# Patient Record
Sex: Female | Born: 1990 | Hispanic: No | Marital: Married | Smoking: Never smoker
Health system: Southern US, Community
[De-identification: ages and names within clinical notes are randomized; demographics above are authoritative.]

---

## 2020-07-09 DIAGNOSIS — H833X1 Noise effects on right inner ear: Secondary | ICD-10-CM | POA: Insufficient documentation

## 2020-07-09 DIAGNOSIS — H6123 Impacted cerumen, bilateral: Secondary | ICD-10-CM | POA: Insufficient documentation

## 2021-07-04 ENCOUNTER — Encounter: Payer: Self-pay | Admitting: Family Medicine

## 2021-07-04 ENCOUNTER — Ambulatory Visit: Payer: Self-pay

## 2021-07-04 ENCOUNTER — Other Ambulatory Visit: Payer: Self-pay

## 2021-07-04 ENCOUNTER — Ambulatory Visit (INDEPENDENT_AMBULATORY_CARE_PROVIDER_SITE_OTHER): Payer: 59 | Admitting: Family Medicine

## 2021-07-04 ENCOUNTER — Ambulatory Visit
Admission: RE | Admit: 2021-07-04 | Discharge: 2021-07-04 | Disposition: A | Discharge status: Home / Self Care | Payer: 59 | Source: Ambulatory Visit | Attending: Family Medicine | Admitting: Family Medicine

## 2021-07-04 VITALS — BP 110/72 | HR 74 | Temp 97.2°F | Ht 65.0 in | Wt 158.2 lb

## 2021-07-04 DIAGNOSIS — R091 Pleurisy: Secondary | ICD-10-CM | POA: Insufficient documentation

## 2021-07-04 DIAGNOSIS — R072 Precordial pain: Secondary | ICD-10-CM | POA: Insufficient documentation

## 2021-07-04 DIAGNOSIS — R079 Chest pain, unspecified: Secondary | ICD-10-CM | POA: Diagnosis not present

## 2021-07-04 DIAGNOSIS — Z0000 Encounter for general adult medical examination without abnormal findings: Secondary | ICD-10-CM

## 2021-07-04 NOTE — Progress Notes (Addendum)
New Patient Office Visit  Subjective:  Patient ID: Angela Forbes, female    DOB: 13-Feb-1991  Age: 30 y.o. MRN: [id number]  CC:  Chief Complaint  Patient presents with   Establish Care    NP/establish care concerns about chest pains some SOB pain radiates to left arm.     HPI Angela Forbes presents for for health check and follow-up of an approximately 1 month history of chest pain that she feels in the midsternal area sometimes seems to radiate up into the neck.  It last for seconds.  More rested recently is been associated with a deep breath.  It is not exertional.  There is no DOE, SOB, N/V, diaphoresis or exertional component.  There is no cough fever or chills.  No history of DVT.  She does not smoke drink alcohol or use illicit drugs.  She and her husband are using barrier contraception.  Her father died at age 2 after he presented to the emergency room with abdominal pain.  Mother is still living and she does have hypertension.  She is accompanied today by her husband pends to 2 small children.  Female care is through GYN provider.  Normal Pap this past year.    History reviewed. No pertinent past medical history.  History reviewed. No pertinent surgical history.  Family History  Problem Relation Age of Onset   Diabetes Mother    Hypertension Mother     Social History   Socioeconomic History   Marital status: Married    Spouse name: Not on file   Number of children: Not on file   Years of education: Not on file   Highest education level: Not on file  Occupational History   Not on file  Tobacco Use   Smoking status: Never   Smokeless tobacco: Never  Vaping Use   Vaping Use: Never used  Substance and Sexual Activity   Alcohol use: Never   Drug use: Never   Sexual activity: Yes  Other Topics Concern   Not on file  Social History Narrative   Not on file   Social Determinants of Health   Financial Resource Strain: Not on file  Food Insecurity: Not on  file  Transportation Needs: Not on file  Physical Activity: Not on file  Stress: Not on file  Social Connections: Not on file  Intimate Partner Violence: Not on file    ROS Review of Systems  Constitutional:  Negative for chills, diaphoresis, fatigue, fever and unexpected weight change.  HENT: Negative.    Eyes:  Negative for photophobia and visual disturbance.  Respiratory:  Negative for cough, chest tightness, shortness of breath and wheezing.   Cardiovascular:  Positive for chest pain. Negative for palpitations and leg swelling.  Gastrointestinal:  Negative for abdominal pain, nausea and vomiting.  Endocrine: Negative for polyphagia and polyuria.  Genitourinary: Negative.   Musculoskeletal:  Negative for gait problem and joint swelling.  Skin:  Negative for rash.  Neurological:  Negative for speech difficulty and weakness.  Depression screen [facility] 2/9 07/04/2021  Decreased Interest 0  Down, Depressed, Hopeless 0  PHQ - 2 Score 0       Objective:   Today's Vitals: BP 110/72 (BP Location: Right Arm, Patient Position: Sitting, Cuff Size: Normal)   Pulse 74   Temp (!) 97.2 F (36.2 C) (Temporal)   Ht 5\' 5"  (1.651 m)   Wt 158 lb 3.2 oz (71.8 kg)   SpO2 98%   BMI 26.33 kg/m  Physical Exam Vitals and nursing note reviewed.  Constitutional:      General: She is not in acute distress.    Appearance: Normal appearance. She is normal weight. She is not ill-appearing, toxic-appearing or diaphoretic.  HENT:     Head: Normocephalic and atraumatic.     Right Ear: Tympanic membrane, ear canal and external ear normal.     Left Ear: Tympanic membrane, ear canal and external ear normal.     Mouth/Throat:     Mouth: Mucous membranes are moist.     Pharynx: Oropharynx is clear. No oropharyngeal exudate or posterior oropharyngeal erythema.  Eyes:     General: No scleral icterus.       Right eye: No discharge.        Left eye: No discharge.     Extraocular Movements: Extraocular  movements intact.     Conjunctiva/sclera: Conjunctivae normal.     Pupils: Pupils are equal, round, and reactive to light.  Cardiovascular:     Rate and Rhythm: Normal rate and regular rhythm.     Heart sounds: No murmur heard. Pulmonary:     Effort: Pulmonary effort is normal.     Breath sounds: Normal breath sounds.  Abdominal:     General: Bowel sounds are normal.  Musculoskeletal:     Cervical back: No rigidity or tenderness.     Right lower leg: No edema.     Left lower leg: No edema.  Lymphadenopathy:     Cervical: No cervical adenopathy.  Skin:    General: Skin is warm and dry.  Neurological:     Mental Status: She is alert and oriented to person, place, and time.  Psychiatric:        Mood and Affect: Mood normal.        Behavior: Behavior normal.    Assessment & Plan:   Problem List Items Addressed This Visit   None Visit Diagnoses     Healthcare maintenance    -  Primary   Relevant Orders   CBC   Comprehensive metabolic panel   LDL cholesterol, direct   Urinalysis, Routine w reflex microscopic   Chest pain, unspecified type       Relevant Orders   EKG 12-Lead   DG Chest 2 View       Outpatient Encounter Medications as of 07/04/2021  Medication Sig   Prenatal MV-Min-Fe Fum-FA-DHA (PRENATAL 1 PO) Take 1 tablet by mouth daily.   No facility-administered encounter medications on file as of 07/04/2021.    Follow-up: Return in about 1 month (around 08/03/2021), or if symptoms worsen or fail to improve.  Given information on health maintenance and disease prevention.  Follow-up in 1 month for recheck.  Symptoms suggest pleurisy but there is no cough or fever.  Risk of serious etiology unlikely, is 30 year old otherwise healthy female. Angela Sax, MD

## 2021-07-05 LAB — COMPREHENSIVE METABOLIC PANEL
AG Ratio: 1.5 (calc) (ref 1.0–2.5)
ALT: 11 U/L (ref 6–29)
AST: 17 U/L (ref 10–30)
Albumin: 4.5 g/dL (ref 3.6–5.1)
Alkaline phosphatase (APISO): 67 U/L (ref 31–125)
BUN/Creatinine Ratio: 23 (calc) — ABNORMAL HIGH (ref 6–22)
BUN: 10 mg/dL (ref 7–25)
CO2: 25 mmol/L (ref 20–32)
Calcium: 9.3 mg/dL (ref 8.6–10.2)
Chloride: 101 mmol/L (ref 98–110)
Creat: 0.44 mg/dL — ABNORMAL LOW (ref 0.50–0.97)
Globulin: 3 g/dL (calc) (ref 1.9–3.7)
Glucose, Bld: 85 mg/dL (ref 65–99)
Potassium: 3.7 mmol/L (ref 3.5–5.3)
Sodium: 138 mmol/L (ref 135–146)
Total Bilirubin: 0.3 mg/dL (ref 0.2–1.2)
Total Protein: 7.5 g/dL (ref 6.1–8.1)

## 2021-07-05 LAB — CBC
HCT: 35.3 % (ref 35.0–45.0)
Hemoglobin: 11.7 g/dL (ref 11.7–15.5)
MCH: 29.3 pg (ref 27.0–33.0)
MCHC: 33.1 g/dL (ref 32.0–36.0)
MCV: 88.3 fL (ref 80.0–100.0)
MPV: 10.1 fL (ref 7.5–12.5)
Platelets: 250 10*3/uL (ref 140–400)
RBC: 4 10*6/uL (ref 3.80–5.10)
RDW: 12.5 % (ref 11.0–15.0)
WBC: 6.2 10*3/uL (ref 3.8–10.8)

## 2021-07-05 LAB — LDL CHOLESTEROL, DIRECT: Direct LDL: 73 mg/dL (ref <100)

## 2021-07-05 LAB — TIQ-MISC

## 2021-07-07 ENCOUNTER — Other Ambulatory Visit: Payer: Self-pay | Admitting: Family Medicine

## 2021-07-07 ENCOUNTER — Ambulatory Visit (INDEPENDENT_AMBULATORY_CARE_PROVIDER_SITE_OTHER): Payer: 59

## 2021-07-07 ENCOUNTER — Telehealth: Payer: Self-pay | Admitting: Family Medicine

## 2021-07-07 DIAGNOSIS — R079 Chest pain, unspecified: Secondary | ICD-10-CM

## 2021-07-07 NOTE — Telephone Encounter (Signed)
Pt called and requested call back about lab and xray results, please advise. Callback: [phone]

## 2021-07-09 NOTE — Telephone Encounter (Signed)
Patient aware of labs and xray

## 2021-07-10 ENCOUNTER — Telehealth: Payer: Self-pay | Admitting: Family Medicine

## 2021-07-17 NOTE — Telephone Encounter (Signed)
Patient was called will pick it up!

## 2021-08-05 ENCOUNTER — Ambulatory Visit: Payer: 59 | Admitting: Family Medicine

## 2022-08-18 LAB — HM HEPATITIS C SCREENING LAB: HM Hepatitis Screen: NEGATIVE

## 2022-12-22 LAB — HM HIV SCREENING LAB: HM HIV Screening: NEGATIVE

## 2023-01-07 IMAGING — DX DG CHEST 2V
2 series · 2 of 2 positions shown · non-contrast
Comparison: None.

CLINICAL DATA: Chest pain.

EXAM:
CHEST - 2 VIEW

[chest pa]
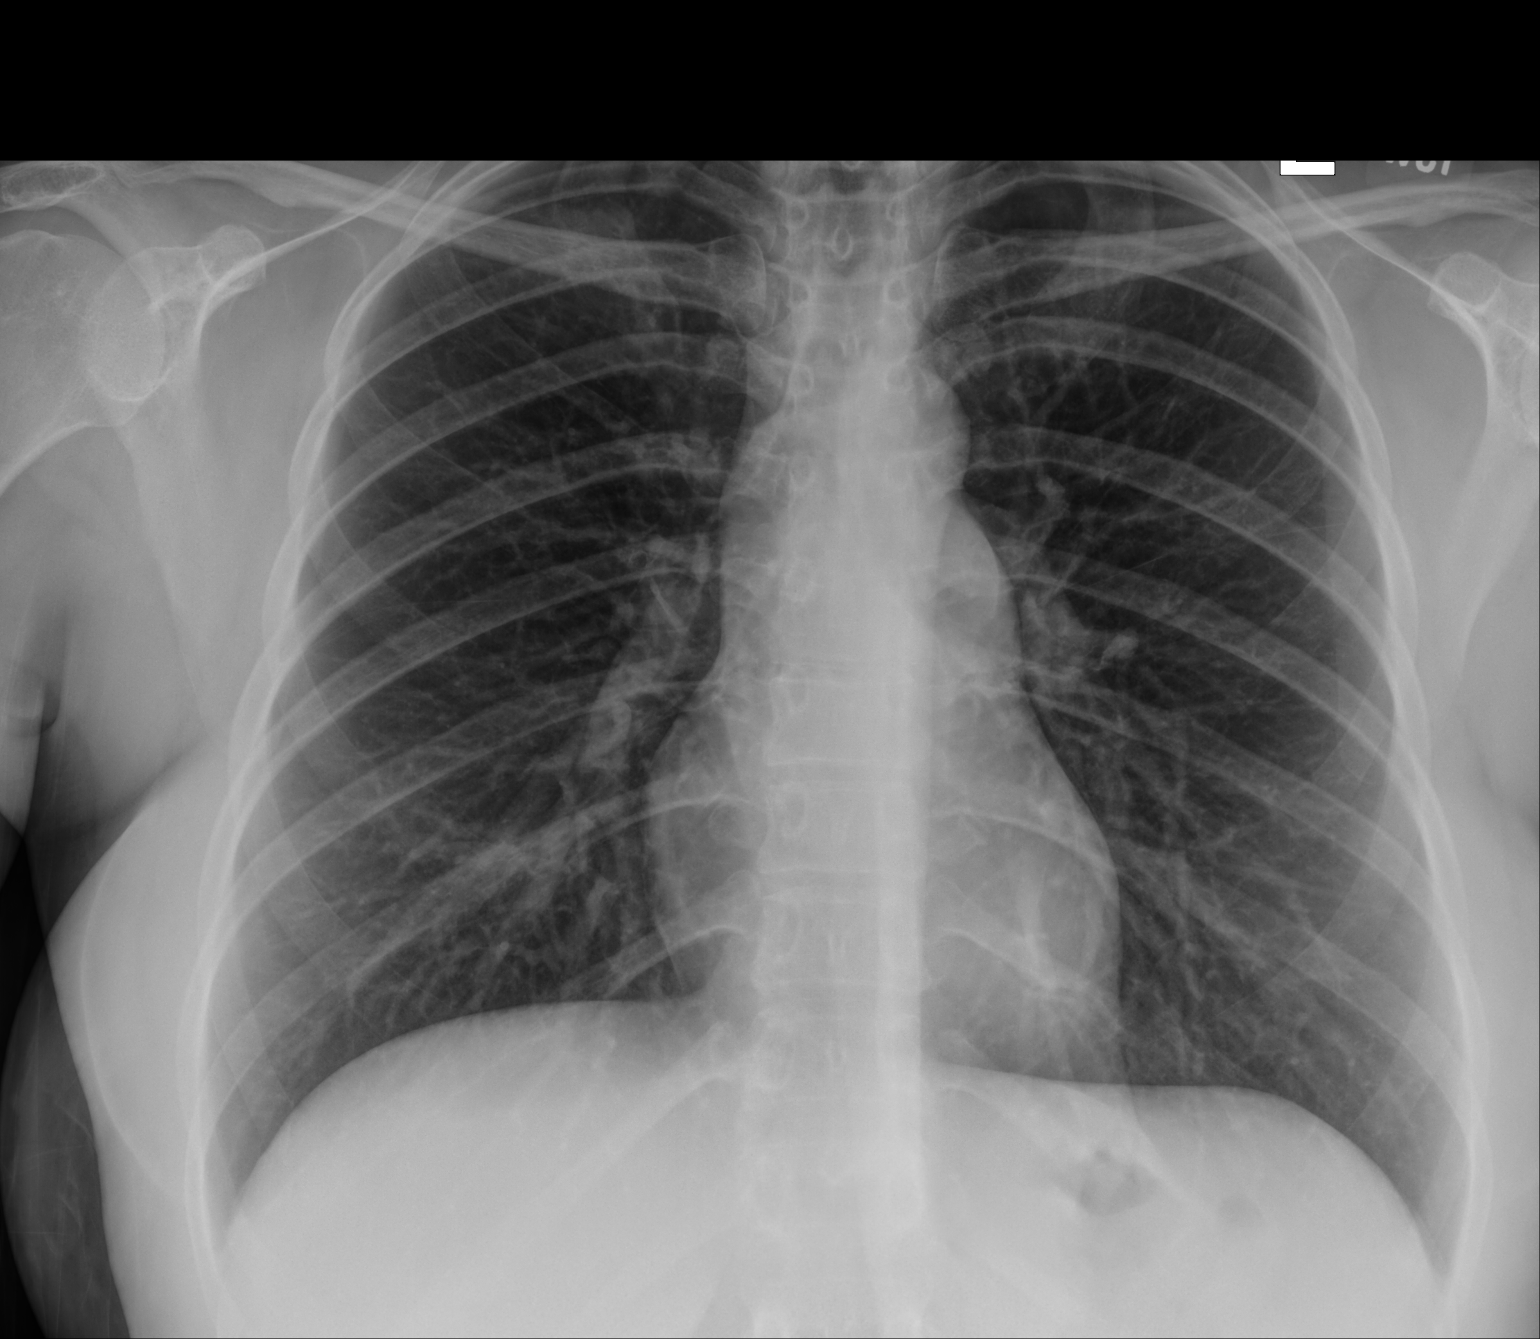

[chest lat]
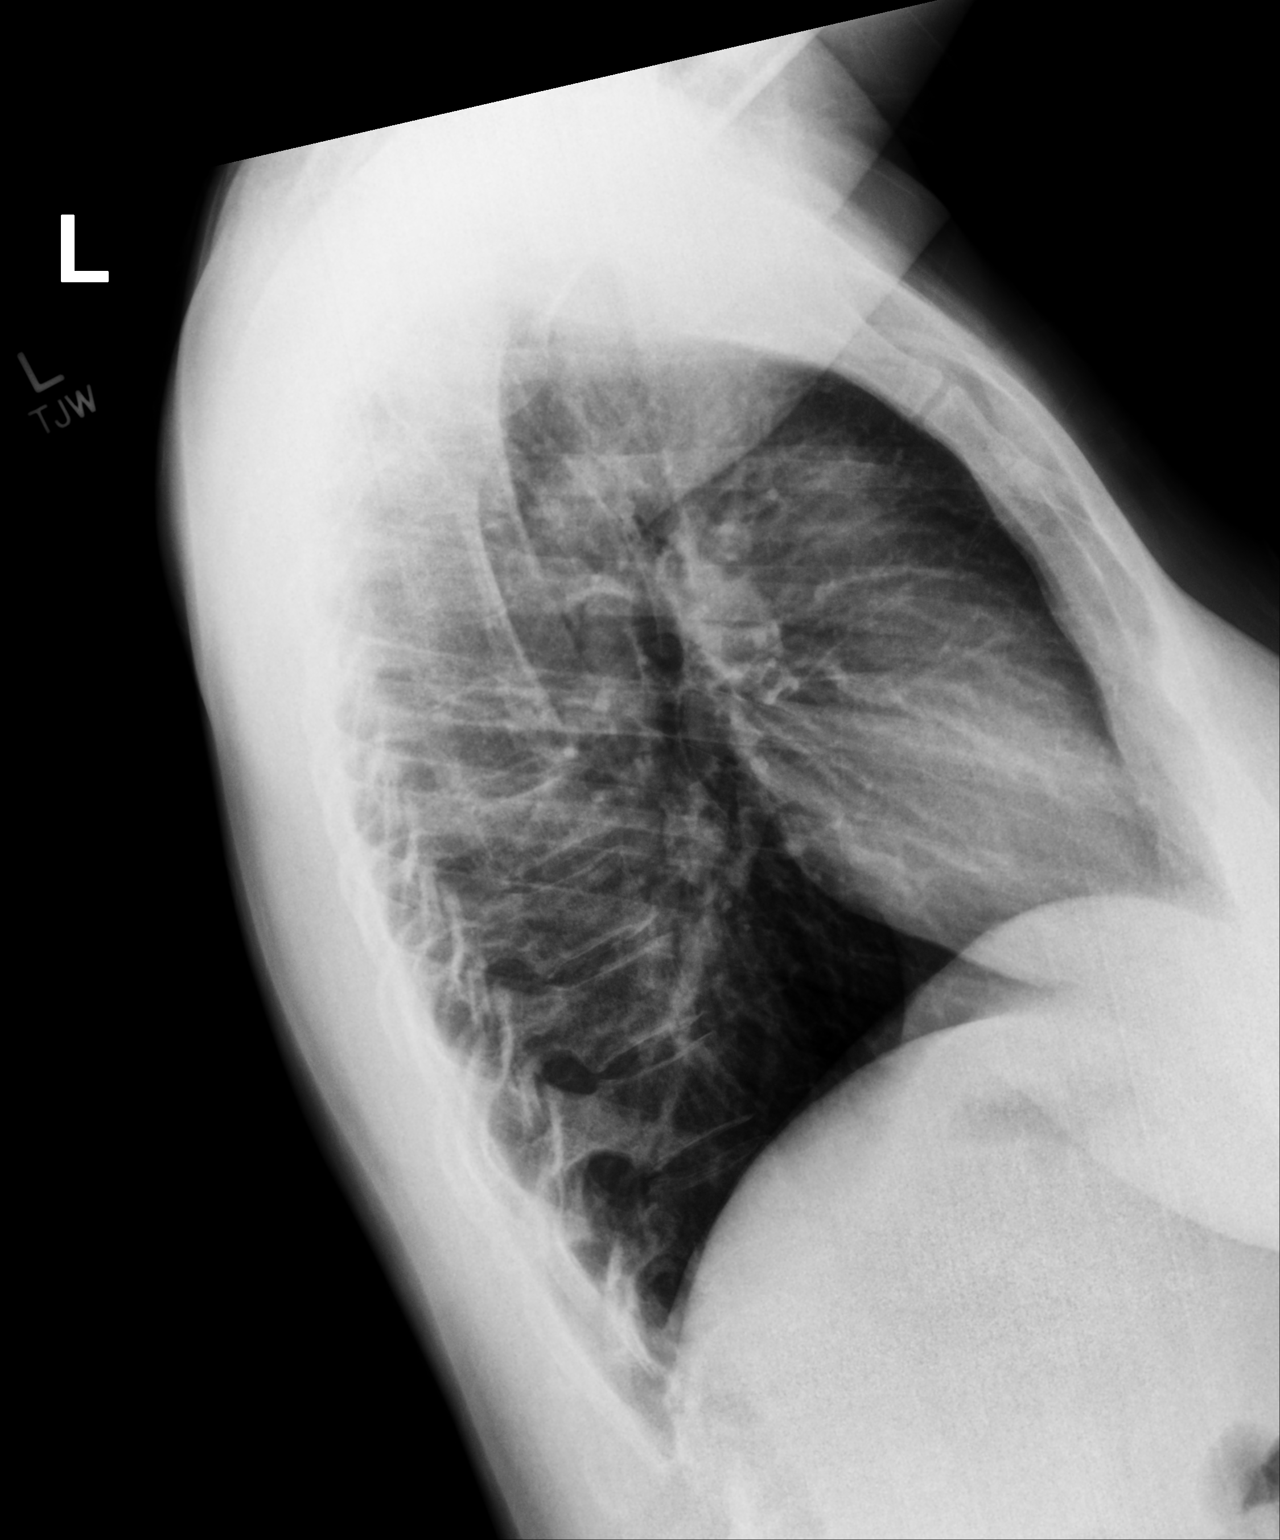

[2 of 2 positions shown; findings below may reference images not displayed]

FINDINGS: The heart size and mediastinal contours are within normal limits.
Both lungs are clear. The visualized skeletal structures are
unremarkable.
IMPRESSION: No active cardiopulmonary disease.

## 2023-06-03 ENCOUNTER — Ambulatory Visit
Admission: EM | Admit: 2023-06-03 | Discharge: 2023-06-03 | Disposition: A | Discharge status: Home / Self Care | Payer: Medicaid Other | Attending: Internal Medicine | Admitting: Internal Medicine

## 2023-06-03 DIAGNOSIS — H6991 Unspecified Eustachian tube disorder, right ear: Secondary | ICD-10-CM | POA: Diagnosis not present

## 2023-06-03 DIAGNOSIS — H60501 Unspecified acute noninfective otitis externa, right ear: Secondary | ICD-10-CM

## 2023-06-03 MED ORDER — FLUTICASONE PROPIONATE 50 MCG/ACT NA SUSP
1.0000 | Freq: Every day | NASAL | 0 refills | Status: AC
Start: 2023-06-03 — End: ?

## 2023-06-03 MED ORDER — NEOMYCIN-POLYMYXIN-HC 3.5-10000-1 OT SUSP
4.0000 [drp] | Freq: Three times a day (TID) | OTIC | 0 refills | Status: AC
Start: 2023-06-03 — End: 2023-06-10

## 2023-06-03 NOTE — ED Provider Notes (Signed)
[facility]    CSN: [id number] Arrival date & time: 06/03/23  5009      History   Chief Complaint No chief complaint on file.   HPI Angela Forbes is a 32 y.o. female presents for evaluation of right ear pain.  Patient reports 1 day of right ear pain that occurred after using a Q-tip.  Reports an aching/sharp pain without any ear drainage, hearing changes, fevers or chills.  Denies any URI symptoms.  Does endorse some popping or crackling with chewing.  She has been taking ibuprofen OTC for symptoms.  She is currently breast-feeding.  No other concerns at  HPI  History reviewed. No pertinent past medical history.  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 No pertinent surgical history.  OB History   No obstetric history on file.      Home Medications    Prior to Admission medications   Medication Sig Start Date End Date Taking? Authorizing Provider  ferrous sulfate 325 (65 FE) MG tablet Take by mouth. 01/27/23 01/27/24 Yes [provider]  fluticasone (FLONASE) 50 MCG/ACT nasal spray Place 1 spray into both nostrils daily. 06/03/23  Yes Radford Pax, NP  neomycin-polymyxin-hydrocortisone (CORTISPORIN) 3.5-10000-1 OTIC suspension Place 4 drops into the right ear 3 (three) times daily for 7 days. 06/03/23 06/10/23 Yes Radford Pax, NP  Prenatal MV-Min-Fe Fum-FA-DHA (PRENATAL 1 PO) Take 1 tablet by mouth daily. 08/15/19   [provider]    Family History Family History  Problem Relation Age of Onset   Diabetes Mother    Hypertension Mother     Social History Social History   Tobacco Use   Smoking status: Never   Smokeless tobacco: Never  Vaping Use   Vaping status: Never  Used  Substance Use Topics   Alcohol use: Never   Drug use: Never     Allergies   Gelatin   Review of Systems Review of Systems  HENT:  Positive for ear pain.      Physical Exam Triage Vital Signs ED Triage Vitals  Encounter Vitals Group     BP 06/03/23 0850 108/73     Systolic BP Percentile --      Diastolic BP Percentile --      Pulse Rate 06/03/23 0850 69     Resp 06/03/23 0850 16     Temp 06/03/23 0850 (!) 97.5 F (36.4 C)     Temp Source 06/03/23 0850 Oral     SpO2 06/03/23 0850 97 %     Weight --      Height --      Head Circumference --      Peak Flow --      Pain Score 06/03/23 0854 8     Pain Loc --      Pain Education --      Exclude from Growth Chart --    No data found.  Updated Vital Signs BP 108/73 (BP Location: Right Arm)   Pulse 69   Temp (!) 97.5 F (36.4 C) (Oral)   Resp 16   SpO2 97%   Breastfeeding Yes   Visual Acuity Right Eye Distance:   Left Eye Distance:   Bilateral Distance:    Right  Eye Near:   Left Eye Near:    Bilateral Near:     Physical Exam Vitals and nursing note reviewed.  Constitutional:      General: She is not in acute distress.    Appearance: Normal appearance. She is not ill-appearing.  HENT:     Head: Normocephalic and atraumatic.     Right Ear: Swelling present. A middle ear effusion is present. Tympanic membrane is scarred. Tympanic membrane is not perforated or erythematous.     Left Ear: Tympanic membrane and ear canal normal.     Ears:     Comments: Mild swelling of the right external canal Eyes:     Pupils: Pupils are equal, round, and reactive to light.  Cardiovascular:     Rate and Rhythm: Normal rate.  Pulmonary:     Effort: Pulmonary effort is normal.  Skin:    General: Skin is warm and dry.  Neurological:     General: No focal deficit present.     Mental Status: She is alert and oriented to person, place, and time.  Psychiatric:        Mood and Affect: Mood normal.        Behavior:  Behavior normal.      UC Treatments / Results  Labs (all labs ordered are listed, but only abnormal results are displayed) Labs Reviewed - No data to display  EKG   Radiology No results found.  Procedures Procedures (including critical care time)  Medications Ordered in UC Medications - No data to display  Initial Impression / Assessment and Plan / UC Course  I have reviewed the triage vital signs and the nursing notes.  Pertinent labs & imaging results that were available during my care of the patient were reviewed by me and considered in my medical decision making (see chart for details).     Reviewed exam and symptoms with patient.  No red flags.  Will start Flonase daily for eustachian tube dysfunction as well as Cortisporin for mild otitis externa.  Patient to keep water out of the ear until treatment is complete.  PCP follow-up if symptoms do not improve.  ER precautions reviewed and patient verbalized understanding. Final Clinical Impressions(s) / UC Diagnoses   Final diagnoses:  Acute otitis externa of right ear, unspecified type  Eustachian tube dysfunction, right     Discharge Instructions      Start antibiotic eardrops and Flonase nasal spray as prescribed.  Keep water out of that right ear until you are done with your treatment.  Please follow-up with your PCP if your symptoms or not improving.  Please go to the ER if you develop any worsening symptoms.  I hope you feel better soon!    ED Prescriptions     Medication Sig Dispense Auth. Provider   fluticasone (FLONASE) 50 MCG/ACT nasal spray Place 1 spray into both nostrils daily. 15.8 mL Radford Pax, NP   neomycin-polymyxin-hydrocortisone (CORTISPORIN) 3.5-10000-1 OTIC suspension Place 4 drops into the right ear 3 (three) times daily for 7 days. 10 mL Radford Pax, NP      PDMP not reviewed this encounter.   Radford Pax, NP 06/03/23 [phone]

## 2023-06-03 NOTE — ED Triage Notes (Addendum)
Pt presents to UC w/ c/o right ear pain since yesterday after using a qtip. Pain radiated to right jaw and pt states it was too painful to eat yesterday. Pt tried ibuprofen helps a little.

## 2023-06-03 NOTE — Discharge Instructions (Signed)
Start antibiotic eardrops and Flonase nasal spray as prescribed.  Keep water out of that right ear until you are done with your treatment.  Please follow-up with your PCP if your symptoms or not improving.  Please go to the ER if you develop any worsening symptoms.  I hope you feel better soon!

## 2023-06-14 ENCOUNTER — Ambulatory Visit (INDEPENDENT_AMBULATORY_CARE_PROVIDER_SITE_OTHER): Payer: Medicaid Other | Admitting: Family Medicine

## 2023-06-14 ENCOUNTER — Encounter: Payer: Self-pay | Admitting: Family Medicine

## 2023-06-14 VITALS — BP 110/78 | HR 84 | Temp 98.1°F | Ht 65.0 in | Wt 183.0 lb

## 2023-06-14 DIAGNOSIS — Z0000 Encounter for general adult medical examination without abnormal findings: Secondary | ICD-10-CM

## 2023-06-14 DIAGNOSIS — Z13220 Encounter for screening for lipoid disorders: Secondary | ICD-10-CM | POA: Diagnosis not present

## 2023-06-14 DIAGNOSIS — Z0001 Encounter for general adult medical examination with abnormal findings: Secondary | ICD-10-CM | POA: Diagnosis not present

## 2023-06-14 DIAGNOSIS — E611 Iron deficiency: Secondary | ICD-10-CM | POA: Diagnosis not present

## 2023-06-14 DIAGNOSIS — N63 Unspecified lump in unspecified breast: Secondary | ICD-10-CM | POA: Diagnosis not present

## 2023-06-14 DIAGNOSIS — E559 Vitamin D deficiency, unspecified: Secondary | ICD-10-CM

## 2023-06-14 DIAGNOSIS — M7662 Achilles tendinitis, left leg: Secondary | ICD-10-CM | POA: Insufficient documentation

## 2023-06-14 LAB — URINALYSIS, ROUTINE W REFLEX MICROSCOPIC
Bilirubin Urine: NEGATIVE
Ketones, ur: NEGATIVE
Leukocytes,Ua: NEGATIVE
Nitrite: NEGATIVE
Specific Gravity, Urine: 1.025 (ref 1.000–1.030)
Urine Glucose: NEGATIVE
Urobilinogen, UA: 0.2 (ref 0.0–1.0)
pH: 6 (ref 5.0–8.0)

## 2023-06-14 LAB — CBC WITH DIFFERENTIAL/PLATELET
Basophils Absolute: 0 10*3/uL (ref 0.0–0.1)
Basophils Relative: 0.3 % (ref 0.0–3.0)
Eosinophils Absolute: 0.1 10*3/uL (ref 0.0–0.7)
Eosinophils Relative: 1.2 % (ref 0.0–5.0)
HCT: 38.3 % (ref 36.0–46.0)
Hemoglobin: 12.6 g/dL (ref 12.0–15.0)
Lymphocytes Relative: 35.6 % (ref 12.0–46.0)
Lymphs Abs: 1.8 10*3/uL (ref 0.7–4.0)
MCHC: 32.8 g/dL (ref 30.0–36.0)
MCV: 89.9 fl (ref 78.0–100.0)
Monocytes Absolute: 0.4 10*3/uL (ref 0.1–1.0)
Monocytes Relative: 6.9 % (ref 3.0–12.0)
Neutro Abs: 2.9 10*3/uL (ref 1.4–7.7)
Neutrophils Relative %: 56 % (ref 43.0–77.0)
Platelets: 268 10*3/uL (ref 150.0–400.0)
RBC: 4.26 Mil/uL (ref 3.87–5.11)
RDW: 13.8 % (ref 11.5–15.5)
WBC: 5.2 10*3/uL (ref 4.0–10.5)

## 2023-06-14 LAB — COMPREHENSIVE METABOLIC PANEL
ALT: 21 U/L (ref 0–35)
AST: 17 U/L (ref 0–37)
Albumin: 4.3 g/dL (ref 3.5–5.2)
Alkaline Phosphatase: 63 U/L (ref 39–117)
BUN: 14 mg/dL (ref 6–23)
CO2: 25 mEq/L (ref 19–32)
Calcium: 9 mg/dL (ref 8.4–10.5)
Chloride: 103 mEq/L (ref 96–112)
Creatinine, Ser: 0.49 mg/dL (ref 0.40–1.20)
GFR: 124.94 mL/min (ref >60.00)
Glucose, Bld: 77 mg/dL (ref 70–99)
Potassium: 3.7 mEq/L (ref 3.5–5.1)
Sodium: 139 mEq/L (ref 135–145)
Total Bilirubin: 0.6 mg/dL (ref 0.2–1.2)
Total Protein: 7.3 g/dL (ref 6.0–8.3)

## 2023-06-14 LAB — LIPID PANEL
Cholesterol: 159 mg/dL (ref 0–200)
HDL: 54.6 mg/dL (ref >39.00)
LDL Cholesterol: 96 mg/dL (ref 0–99)
NonHDL: 104.7
Total CHOL/HDL Ratio: 3
Triglycerides: 43 mg/dL (ref 0.0–149.0)
VLDL: 8.6 mg/dL (ref 0.0–40.0)

## 2023-06-14 LAB — VITAMIN D 25 HYDROXY (VIT D DEFICIENCY, FRACTURES): VITD: 22.73 ng/mL — ABNORMAL LOW (ref 30.00–100.00)

## 2023-06-14 NOTE — Progress Notes (Addendum)
Of Angela Forbes she is  Established Patient Office Visit   Subjective:  Patient ID: Angela Forbes, female    DOB: January 11, 1991  Age: 32 y.o. MRN: [id number]  Chief Complaint  Patient presents with   Annual Exam    Pt here for Annual Exam and is currently fasting. Pt needing a pap but would like to schedule with a Female provider    HPI Encounter Diagnoses  Name Primary?   Healthcare maintenance Yes   Achilles tendinitis of left lower extremity    Vitamin D deficiency    Iron deficiency    Lipid screening    Mass of breast, unspecified laterality    For physical status post parturition over third child this past July.  Baby is 2 months old and doing well.  Patient developed ear pain 3 weeks ago and was seen in urgent care.  She was prescribed an antibiotic and fluticasone nose spray.  She is having some discomfort in her left ear now.  She denies facial pressure teeth pain postnasal drip or rhinorrhea.  She is having pain in her left heel.  There was no injury.  Her pregnancy was complicated by iron deficiency and vitamin D deficiency.  She received an iron infusion and continues with iron supplementation.  Her father passed in his early 74s.  He had been diagnosed with hypertension.  Her mom is in her mid 32s and as hypertension as well.   Review of Systems  Constitutional: Negative.   HENT:  Positive for ear pain. Negative for congestion, hearing loss, sinus pain, sore throat and tinnitus.   Eyes:  Negative for blurred vision, discharge and redness.  Respiratory: Negative.  Negative for cough.   Cardiovascular: Negative.   Gastrointestinal:  Negative for abdominal pain.  Genitourinary: Negative.   Musculoskeletal:  Positive for joint pain. Negative for myalgias.  Skin:  Negative for rash.  Neurological:  Negative for tingling, loss of consciousness and weakness.  Endo/Heme/Allergies:  Negative for polydipsia.     Current Outpatient Medications:    ferrous sulfate 325 (65 FE) MG  tablet, Take by mouth., Disp: , Rfl:    fluticasone (FLONASE) 50 MCG/ACT nasal spray, Place 1 spray into both nostrils daily., Disp: 15.8 mL, Rfl: 0   Prenatal MV-Min-Fe Fum-FA-DHA (PRENATAL 1 PO), Take 1 tablet by mouth daily., Disp: , Rfl:    Objective:     BP 110/78   Pulse 84   Temp 98.1 F (36.7 C)   Ht 5\' 5"  (1.651 m)   Wt 183 lb (83 kg)   SpO2 98%   BMI 30.45 kg/m    Physical Exam Constitutional:      General: She is not in acute distress.    Appearance: Normal appearance. She is not ill-appearing, toxic-appearing or diaphoretic.  HENT:     Head: Normocephalic and atraumatic.     Right Ear: Tympanic membrane, ear canal and external ear normal.     Left Ear: Tympanic membrane, ear canal and external ear normal.     Mouth/Throat:     Mouth: Mucous membranes are moist.     Pharynx: Oropharynx is clear. No oropharyngeal exudate or posterior oropharyngeal erythema.  Eyes:     General: No scleral icterus.       Right eye: No discharge.        Left eye: No discharge.     Extraocular Movements: Extraocular movements intact.     Conjunctiva/sclera: Conjunctivae normal.     Pupils: Pupils are equal, round, and  reactive to light.  Cardiovascular:     Rate and Rhythm: Normal rate and regular rhythm.  Pulmonary:     Effort: Pulmonary effort is normal. No respiratory distress.     Breath sounds: Normal breath sounds.  Abdominal:     General: Bowel sounds are normal.  Musculoskeletal:     Cervical back: No rigidity or tenderness.     Left ankle: No swelling or deformity. No tenderness. Normal range of motion.     Left Achilles Tendon: No tenderness or defects. Thompson's test negative.     Comments: 5/5 strength lower extremities.  Skin:    General: Skin is warm and dry.  Neurological:     Mental Status: She is alert and oriented to person, place, and time.  Psychiatric:        Mood and Affect: Mood normal.        Behavior: Behavior normal.      No results found for  any visits on 06/14/23.    The ASCVD Risk score (Arnett DK, et al., 2019) failed to calculate for the following reasons:   The 2019 ASCVD risk score is only valid for ages 67 to 68    Assessment & Plan:   Healthcare maintenance -     Comprehensive metabolic panel -     Urinalysis, Routine w reflex microscopic  Achilles tendinitis of left lower extremity -     DG Ankle Complete Left; Future  Vitamin D deficiency -     VITAMIN D 25 Hydroxy (Vit-D Deficiency, Fractures)  Iron deficiency -     CBC with Differential/Platelet -     Iron, TIBC and Ferritin Panel  Lipid screening -     Lipid panel  Mass of breast, unspecified laterality -     Digital Screening Mammogram, Left and Right; Future    Return in about 1 year (around 06/13/2024), or Prefers female for Pap and pelvic exams..  Information was given on health maintenance and disease prevention.  Rechecking vitamin D and iron levels.  Information given on Achilles tendinitis.  Continue fluticasone nasal spray.  Mliss Sax, MD

## 2023-06-14 NOTE — Addendum Note (Signed)
Addended by: Andrez Grime on: 06/14/2023 11:08 AM   Modules accepted: Orders

## 2023-06-15 LAB — IRON,TIBC AND FERRITIN PANEL
%SAT: 46 % — ABNORMAL HIGH (ref 16–45)
Ferritin: 126 ng/mL (ref 16–154)
Iron: 134 ug/dL (ref 40–190)
TIBC: 289 ug/dL (ref 250–450)

## 2023-06-15 MED ORDER — VITAMIN D (ERGOCALCIFEROL) 1.25 MG (50000 UNIT) PO CAPS
50000.0000 [IU] | ORAL_CAPSULE | ORAL | 1 refills | Status: AC
Start: 2023-06-15 — End: ?

## 2023-06-15 NOTE — Addendum Note (Signed)
Addended by: Andrez Grime on: 06/15/2023 07:49 AM   Modules accepted: Orders

## 2023-06-17 ENCOUNTER — Telehealth: Payer: Self-pay | Admitting: Family Medicine

## 2023-06-17 NOTE — Telephone Encounter (Signed)
Pt called and wanted to know about his lab results from her last visit. Please call her

## 2023-06-30 ENCOUNTER — Ambulatory Visit: Payer: Medicaid Other | Admitting: Nurse Practitioner

## 2023-07-01 ENCOUNTER — Ambulatory Visit (INDEPENDENT_AMBULATORY_CARE_PROVIDER_SITE_OTHER): Payer: Medicaid Other | Admitting: Nurse Practitioner

## 2023-07-01 ENCOUNTER — Other Ambulatory Visit (HOSPITAL_COMMUNITY)
Admission: RE | Admit: 2023-07-01 | Discharge: 2023-07-01 | Disposition: A | Discharge status: Home / Self Care | Payer: Medicaid Other | Source: Ambulatory Visit | Attending: Nurse Practitioner | Admitting: Nurse Practitioner

## 2023-07-01 ENCOUNTER — Encounter: Payer: Self-pay | Admitting: Nurse Practitioner

## 2023-07-01 VITALS — BP 104/70 | HR 77 | Temp 97.0°F | Wt 182.0 lb

## 2023-07-01 DIAGNOSIS — E611 Iron deficiency: Secondary | ICD-10-CM

## 2023-07-01 DIAGNOSIS — Z0000 Encounter for general adult medical examination without abnormal findings: Secondary | ICD-10-CM | POA: Diagnosis not present

## 2023-07-01 DIAGNOSIS — N898 Other specified noninflammatory disorders of vagina: Secondary | ICD-10-CM | POA: Insufficient documentation

## 2023-07-01 DIAGNOSIS — Z124 Encounter for screening for malignant neoplasm of cervix: Secondary | ICD-10-CM

## 2023-07-01 DIAGNOSIS — E559 Vitamin D deficiency, unspecified: Secondary | ICD-10-CM | POA: Diagnosis not present

## 2023-07-01 NOTE — Progress Notes (Signed)
BP 104/70 (BP Location: Left Arm)   Pulse 77   Temp (!) 97 F (36.1 C)   Wt 182 lb (82.6 kg)   SpO2 99%   Breastfeeding Yes   BMI 30.29 kg/m    Subjective:    Patient ID: Angela Forbes, female    DOB: August 31, 1991, 32 y.o.   MRN: [id number]  CC: Chief Complaint  Patient presents with   Annual Exam    With PAP    HPI: Angela Forbes is a 32 y.o. female presenting on 07/01/2023 for comprehensive medical examination. Current medical complaints include: vaginal discharge  She states that she had has been having some spotting for a few days every 2 weeks.  She is breast-feeding and had her last baby 3 months ago.  She is having some pelvic pain at times.  She denies dysuria, fevers, vaginal itching and burning.  She has also noticed some vaginal discharge.  She currently lives with: husband, kids Menopausal Symptoms: no  Depression and Anxiety Screen done today and results listed below:     07/01/2023   10:34 AM 06/14/2023    9:25 AM 07/04/2021    3:24 PM  Depression screen PHQ 2/9  Decreased Interest 0 0 0  Down, Depressed, Hopeless 0 0 0  PHQ - 2 Score 0 0 0  Altered sleeping 0    Tired, decreased energy 2    Change in appetite 1    Feeling bad or failure about yourself  0    Trouble concentrating 0    Moving slowly or fidgety/restless 0    Suicidal thoughts 0    PHQ-9 Score 3    Difficult doing work/chores Not difficult at all        07/01/2023   10:35 AM 06/14/2023    9:25 AM  GAD 7 : Generalized Anxiety Score  Nervous, Anxious, on Edge 0 0  Control/stop worrying 0 0  Worry too much - different things 0 0  Trouble relaxing 0 0  Restless 0 0  Easily annoyed or irritable 0 0  Afraid - awful might happen 0 0  Total GAD 7 Score 0 0  Anxiety Difficulty Not difficult at all Not difficult at all    The patient does not have a history of falls. I did not complete a risk assessment for falls. A plan of care for falls was not documented.   Past Medical  History:  No past medical history on file.  Surgical History:  No past surgical history on file.  Medications:  Current Outpatient Medications on File Prior to Visit  Medication Sig   cyanocobalamin (VITAMIN B12) 1000 MCG tablet Take 1 tablet by mouth daily.   ferrous sulfate 325 (65 FE) MG tablet Take by mouth.   Prenatal MV-Min-Fe Fum-FA-DHA (PRENATAL 1 PO) Take 1 tablet by mouth daily.   Vitamin D, Ergocalciferol, (DRISDOL) 1.25 MG (50000 UNIT) CAPS capsule Take 1 capsule (50,000 Units total) by mouth every 7 (seven) days.   fluticasone (FLONASE) 50 MCG/ACT nasal spray Place 1 spray into both nostrils daily. (Patient not taking: Reported on 07/01/2023)   No current facility-administered medications on file prior to visit.    Allergies:  Allergies  Allergen Reactions   Gelatin Anaphylaxis    Cultural reason   Pork-Derived Products Other (See Comments)    Personal omission    Social History:  Social History   Socioeconomic History   Marital status: Married    Spouse name: Not on file  Number of children: Not on file   Years of education: Not on file   Highest education level: Not on file  Occupational History   Not on file  Tobacco Use   Smoking status: Never   Smokeless tobacco: Never  Vaping Use   Vaping status: Never Used  Substance and Sexual Activity   Alcohol use: Never   Drug use: Never   Sexual activity: Yes  Other Topics Concern   Not on file  Social History Narrative   Not on file   Social Determinants of Health   Financial Resource Strain: Not on file  Food Insecurity: Low Risk  (04/04/2023)   Received from [facility]   Hunger Vital Sign    Worried About Running Out of Food in the Last Year: Never true    Ran Out of Food in the Last Year: Never true  Transportation Needs: Not on file (04/04/2023)  Physical Activity: Not on file  Stress: Not on file  Social Connections: Unknown (02/25/2022)   Received from [facility]   Social Network     Social Network: Not on file  Intimate Partner Violence: Unknown (01/22/2022)   Received from [facility]   HITS    Physically Hurt: Not on file    Insult or Talk Down To: Not on file    Threaten Physical Harm: Not on file    Scream or Curse: Not on file   Social History   Tobacco Use  Smoking Status Never  Smokeless Tobacco Never   Social History   Substance and Sexual Activity  Alcohol Use Never    Family History:  Family History  Problem Relation Age of Onset   Diabetes Mother    Hypertension Mother     Past medical history, surgical history, medications, allergies, family history and social history reviewed with patient today and changes made to appropriate areas of the chart.   Review of Systems  Constitutional:  Positive for malaise/fatigue. Negative for fever.  HENT: Negative.    Respiratory: Negative.    Cardiovascular: Negative.   Gastrointestinal: Negative.   Genitourinary:        Spotting since giving birth, vaginal discharge  Musculoskeletal:  Positive for myalgias (right leg at times).  Skin: Negative.   Neurological: Negative.   Psychiatric/Behavioral: Negative.     All other ROS negative except what is listed above and in the HPI.      Objective:    BP 104/70 (BP Location: Left Arm)   Pulse 77   Temp (!) 97 F (36.1 C)   Wt 182 lb (82.6 kg)   SpO2 99%   Breastfeeding Yes   BMI 30.29 kg/m   Wt Readings from Last 3 Encounters:  07/01/23 182 lb (82.6 kg)  06/14/23 183 lb (83 kg)  07/04/21 158 lb 3.2 oz (71.8 kg)    Physical Exam Vitals and nursing note reviewed. Exam conducted with a chaperone present.  Constitutional:      General: She is not in acute distress.    Appearance: Normal appearance.  HENT:     Head: Normocephalic and atraumatic.     Right Ear: Tympanic membrane, ear canal and external ear normal.     Left Ear: Tympanic membrane, ear canal and external ear normal.  Eyes:     Conjunctiva/sclera: Conjunctivae normal.   Cardiovascular:     Rate and Rhythm: Normal rate and regular rhythm.     Pulses: Normal pulses.     Heart sounds: Normal heart sounds.  Pulmonary:     Effort: Pulmonary effort is normal.     Breath sounds: Normal breath sounds.  Abdominal:     Palpations: Abdomen is soft.     Tenderness: There is no abdominal tenderness.  Genitourinary:    General: Normal vulva.     Exam position: Lithotomy position.     Labia:        Right: No rash, tenderness or lesion.        Left: No rash, tenderness or lesion.      Vagina: Vaginal discharge (white) present.     Cervix: Normal.     Uterus: Normal.      Adnexa: Right adnexa normal and left adnexa normal.  Musculoskeletal:        General: Normal range of motion.     Cervical back: Normal range of motion and neck supple.     Right lower leg: No edema.     Left lower leg: No edema.  Lymphadenopathy:     Cervical: No cervical adenopathy.  Skin:    General: Skin is warm and dry.  Neurological:     General: No focal deficit present.     Mental Status: She is alert and oriented to person, place, and time.     Cranial Nerves: No cranial nerve deficit.     Coordination: Coordination normal.     Gait: Gait normal.  Psychiatric:        Mood and Affect: Mood normal.        Behavior: Behavior normal.        Thought Content: Thought content normal.        Judgment: Judgment normal.     Results for orders placed or performed in visit on 07/01/23  [facility] HIV SCREENING LAB  Result Value Ref Range   [facility] HIV Screening Negative - Validated   [facility] HEPATITIS C SCREENING LAB  Result Value Ref Range   [facility] Hepatitis Screen Negative-Validated       Assessment & Plan:   Problem List Items Addressed This Visit       Other   Vitamin D deficiency    Continue taking vitamin D 50,000 units weekly.       Iron deficiency    Iron stable. Continue iron supplement.       Routine general medical examination at a health care facility - Primary    Health  maintenance reviewed and updated. Discussed nutrition, exercise. Recent CMP, CBC reviewed. Follow-up 1 year.        Other Visit Diagnoses     Vaginal discharge       Check for BV, yeast, trich, gonorrhea, chlamdyia.   Relevant Orders   Cervicovaginal ancillary only   Screening for cervical cancer       Pap with HPV done today   Relevant Orders   Cytology - PAP        Follow up plan: No follow-ups on file.   LABORATORY TESTING:  - Pap smear: pap done  IMMUNIZATIONS:   - Tdap: Tetanus vaccination status reviewed: declined. - Influenza:  declined - Pneumovax: Not applicable - Prevnar: Not applicable - HPV:  declined - Shingrix vaccine: Not applicable  SCREENING: -Mammogram: Not applicable  - Colonoscopy: Not applicable  - Bone Density: Not applicable   PATIENT COUNSELING:   Advised to take 1 mg of folate supplement per day if capable of pregnancy.   Sexuality: Discussed sexually transmitted diseases, partner selection, use of condoms, avoidance of unintended pregnancy  and contraceptive  alternatives.   Advised to avoid cigarette smoking.  I discussed with the patient that most people either abstain from alcohol or drink within safe limits (<=14/week and <=4 drinks/occasion for males, <=7/weeks and <= 3 drinks/occasion for females) and that the risk for alcohol disorders and other health effects rises proportionally with the number of drinks per week and how often a drinker exceeds daily limits.  Discussed cessation/primary prevention of drug use and availability of treatment for abuse.   Diet: Encouraged to adjust caloric intake to maintain  or achieve ideal body weight, to reduce intake of dietary saturated fat and total fat, to limit sodium intake by avoiding high sodium foods and not adding table salt, and to maintain adequate dietary potassium and calcium preferably from fresh fruits, vegetables, and low-fat dairy products.    stressed the importance of regular  exercise  Injury prevention: Discussed safety belts, safety helmets, smoke detector, smoking near bedding or upholstery.   Dental health: Discussed importance of regular tooth brushing, flossing, and dental visits.    NEXT PREVENTATIVE PHYSICAL DUE IN 1 YEAR. No follow-ups on file.  Louine Tenpenny A [facility] [employer]

## 2023-07-01 NOTE — Assessment & Plan Note (Signed)
Health maintenance reviewed and updated. Discussed nutrition, exercise.  Recent CMP, CBC reviewed.  Follow-up 1 year.

## 2023-07-01 NOTE — Assessment & Plan Note (Signed)
Continue taking vitamin D 50,000 units weekly.

## 2023-07-01 NOTE — Assessment & Plan Note (Signed)
Iron stable. Continue iron supplement.

## 2023-07-01 NOTE — Patient Instructions (Signed)
It was great to see you!  We will let you know your lab results when they return  Let's follow-up with PCP as scheduled  Take care,  Rodman Pickle, NP

## 2023-07-02 LAB — CERVICOVAGINAL ANCILLARY ONLY
Bacterial Vaginitis (gardnerella): NEGATIVE
Candida Glabrata: NEGATIVE
Candida Vaginitis: NEGATIVE
Chlamydia: NEGATIVE
Comment: NEGATIVE
Comment: NEGATIVE
Comment: NEGATIVE
Comment: NEGATIVE
Comment: NEGATIVE
Comment: NORMAL
Neisseria Gonorrhea: NEGATIVE
Trichomonas: NEGATIVE

## 2023-07-02 LAB — CYTOLOGY - PAP
Comment: NEGATIVE
Diagnosis: NEGATIVE
High risk HPV: NEGATIVE
